# Patient Record
Sex: Female | Born: 1975 | Race: White | Hispanic: No | Marital: Married | Smoking: Former smoker
Health system: Southern US, Community
[De-identification: ages and names within clinical notes are randomized; demographics above are authoritative.]

## PROBLEM LIST (undated history)

## (undated) DIAGNOSIS — D259 Leiomyoma of uterus, unspecified: Secondary | ICD-10-CM

## (undated) DIAGNOSIS — N920 Excessive and frequent menstruation with regular cycle: Secondary | ICD-10-CM

## (undated) DIAGNOSIS — Z8669 Personal history of other diseases of the nervous system and sense organs: Secondary | ICD-10-CM

## (undated) HISTORY — PX: ABLATION: SHX5711

## (undated) HISTORY — PX: NO PAST SURGERIES: SHX2092

## (undated) HISTORY — PX: MOUTH SURGERY: SHX715

---

## 2018-07-19 ENCOUNTER — Other Ambulatory Visit: Payer: Self-pay | Admitting: Obstetrics & Gynecology

## 2018-07-19 DIAGNOSIS — R928 Other abnormal and inconclusive findings on diagnostic imaging of breast: Secondary | ICD-10-CM

## 2018-07-21 ENCOUNTER — Other Ambulatory Visit: Payer: Self-pay | Admitting: Obstetrics & Gynecology

## 2018-07-21 ENCOUNTER — Other Ambulatory Visit: Payer: Self-pay

## 2018-07-21 ENCOUNTER — Ambulatory Visit
Admission: RE | Admit: 2018-07-21 | Discharge: 2018-07-21 | Disposition: A | Discharge status: Home / Self Care | Payer: BC Managed Care – PPO | Source: Ambulatory Visit | Attending: Obstetrics & Gynecology | Admitting: Obstetrics & Gynecology

## 2018-07-21 DIAGNOSIS — N6320 Unspecified lump in the left breast, unspecified quadrant: Secondary | ICD-10-CM

## 2018-07-21 DIAGNOSIS — R928 Other abnormal and inconclusive findings on diagnostic imaging of breast: Secondary | ICD-10-CM

## 2019-01-21 ENCOUNTER — Inpatient Hospital Stay: Admission: RE | Admit: 2019-01-21 | Payer: BC Managed Care – PPO | Source: Ambulatory Visit

## 2019-07-06 ENCOUNTER — Other Ambulatory Visit: Payer: BC Managed Care – PPO

## 2019-10-19 ENCOUNTER — Ambulatory Visit (HOSPITAL_COMMUNITY)
Admission: EM | Admit: 2019-10-19 | Discharge: 2019-10-19 | Disposition: A | Discharge status: Home / Self Care | Payer: BC Managed Care – PPO | Attending: Urgent Care | Admitting: Urgent Care

## 2019-10-19 ENCOUNTER — Other Ambulatory Visit: Payer: Self-pay

## 2019-10-19 ENCOUNTER — Encounter (HOSPITAL_COMMUNITY): Payer: Self-pay | Admitting: Emergency Medicine

## 2019-10-19 DIAGNOSIS — M79672 Pain in left foot: Secondary | ICD-10-CM

## 2019-10-19 DIAGNOSIS — S96912A Strain of unspecified muscle and tendon at ankle and foot level, left foot, initial encounter: Secondary | ICD-10-CM

## 2019-10-19 MED ORDER — NAPROXEN 500 MG PO TABS: 500.0000 mg | ORAL_TABLET | Freq: Two times a day (BID) | ORAL | 0 refills | Status: DC
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
## 2019-12-08 ENCOUNTER — Other Ambulatory Visit: Payer: Self-pay | Admitting: Obstetrics & Gynecology

## 2019-12-08 DIAGNOSIS — N63 Unspecified lump in unspecified breast: Secondary | ICD-10-CM

## 2020-01-04 ENCOUNTER — Ambulatory Visit
Admission: RE | Admit: 2020-01-04 | Discharge: 2020-01-04 | Disposition: A | Discharge status: Home / Self Care | Payer: BC Managed Care – PPO | Source: Ambulatory Visit | Attending: Obstetrics & Gynecology | Admitting: Obstetrics & Gynecology

## 2020-01-04 ENCOUNTER — Other Ambulatory Visit: Payer: Self-pay

## 2020-01-04 DIAGNOSIS — N63 Unspecified lump in unspecified breast: Secondary | ICD-10-CM

## 2020-01-23 ENCOUNTER — Other Ambulatory Visit: Payer: Self-pay | Admitting: Obstetrics & Gynecology

## 2020-01-23 DIAGNOSIS — R928 Other abnormal and inconclusive findings on diagnostic imaging of breast: Secondary | ICD-10-CM

## 2020-05-13 IMAGING — MG DIGITAL DIAGNOSTIC UNILATERAL LEFT MAMMOGRAM WITH TOMO AND CAD
6 series · 6 of 18 positions shown · non-contrast
Comparison: Previous exam(s).

CLINICAL DATA: Possible mass in the posterior aspect of the
upper-outer left breast on a recent screening mammogram.

EXAM:
DIGITAL DIAGNOSTIC LEFT MAMMOGRAM WITH CAD AND TOMO
ULTRASOUND LEFT BREAST

[L CC synth-2D (1 of 2)]
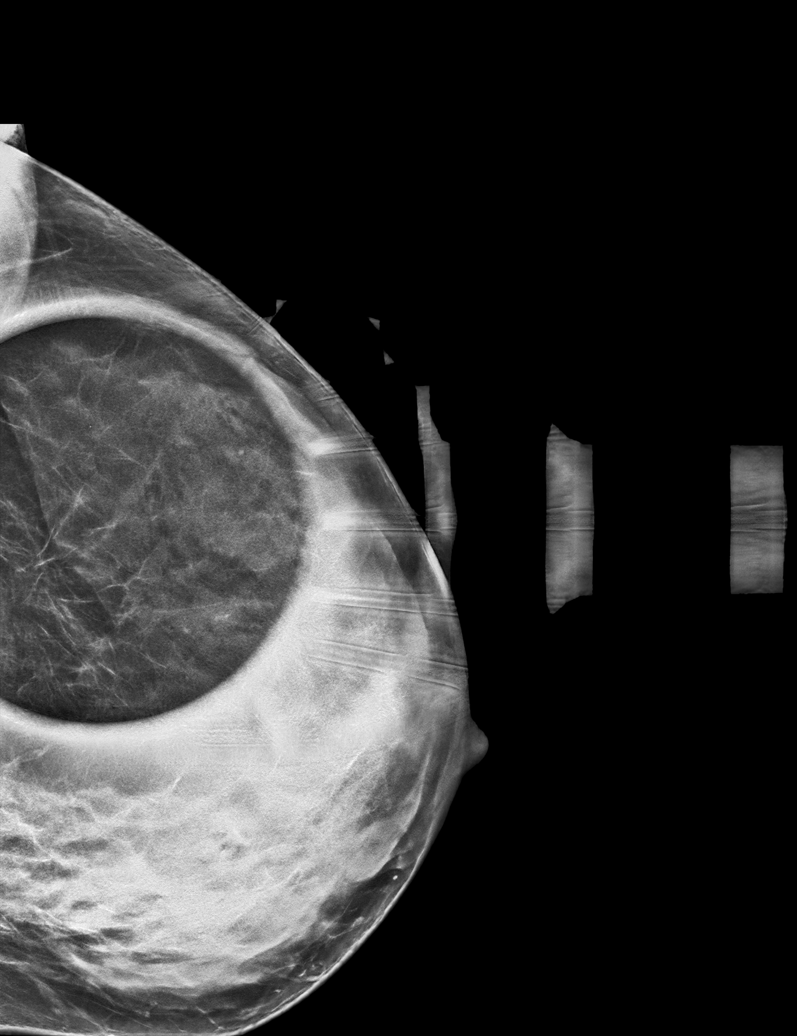

[L CC synth-2D (2 of 2)]
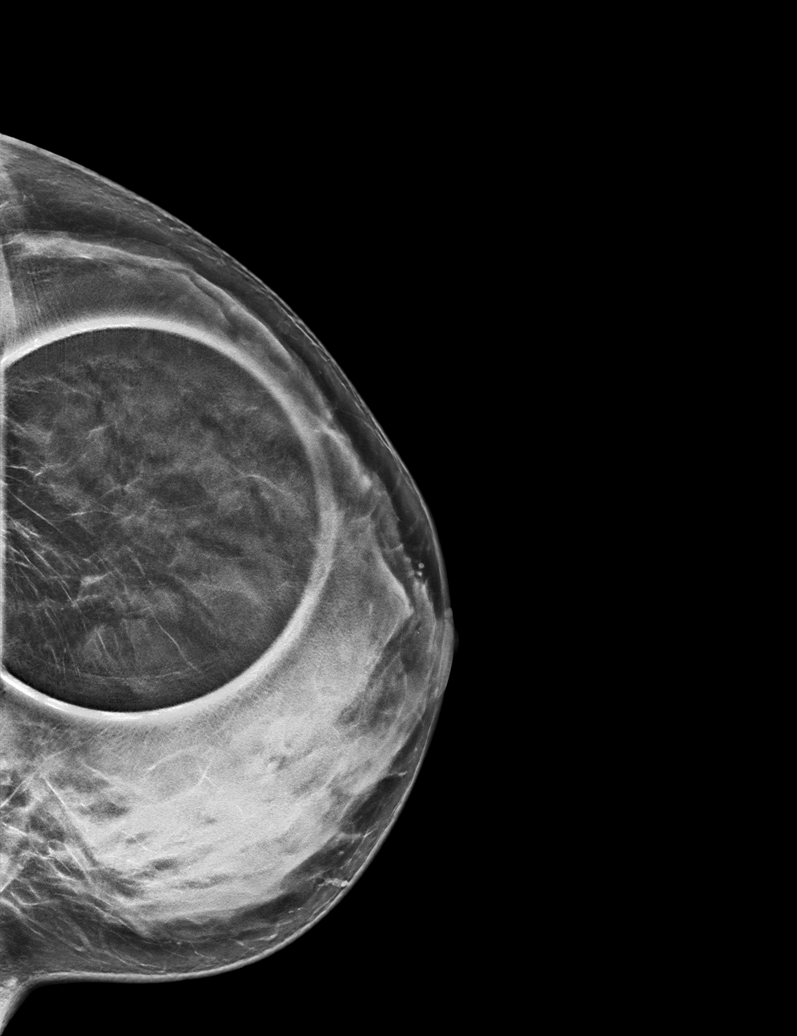

[L MLO synth-2D]
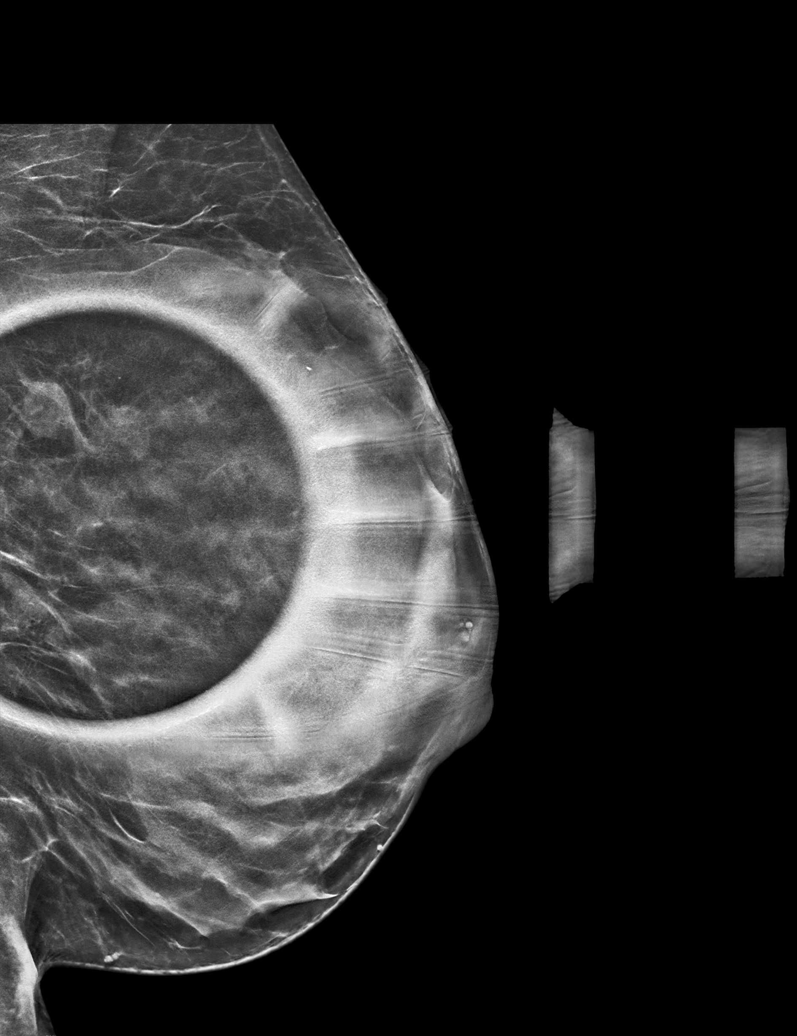

[L CC tomo (1 of 2) · tomo slice 32/63.0]
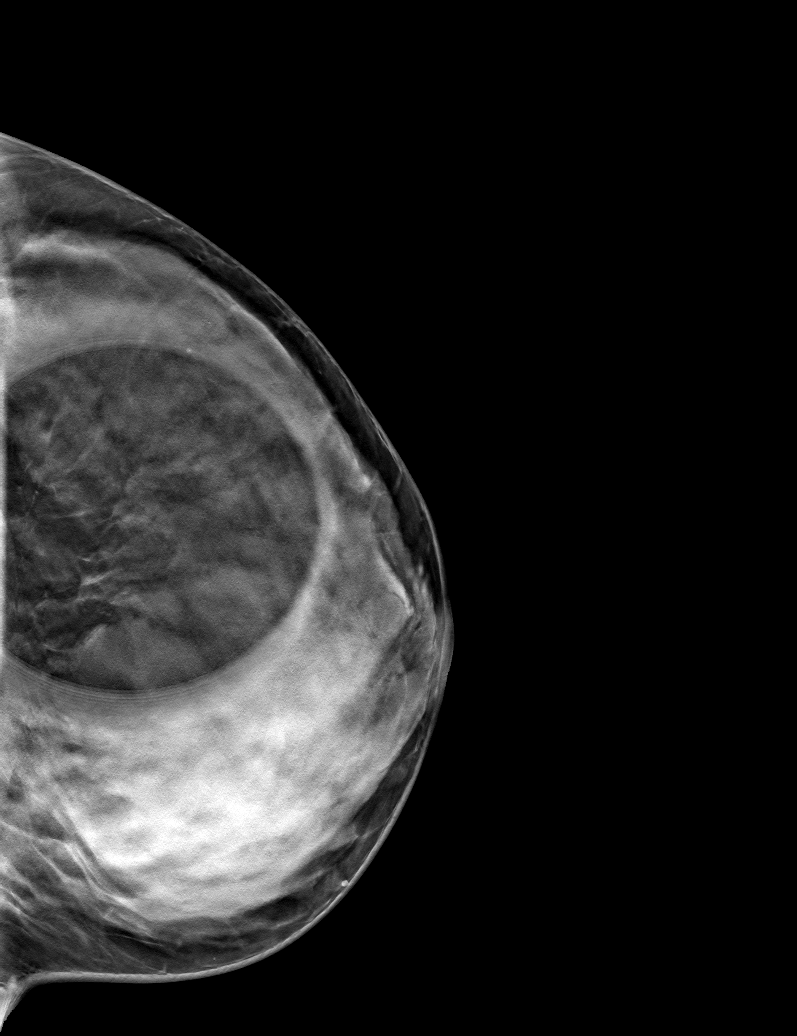

[L MLO tomo · tomo slice 31/60.0]
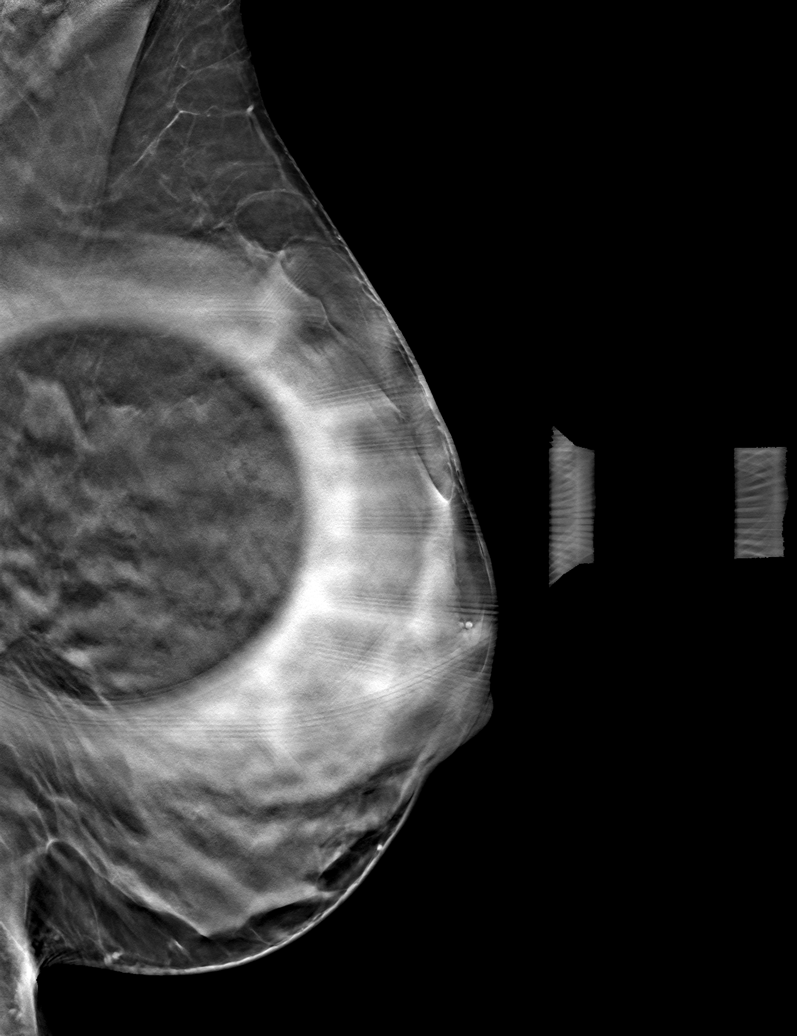

[L CC tomo (2 of 2) · tomo slice 29/57.0]
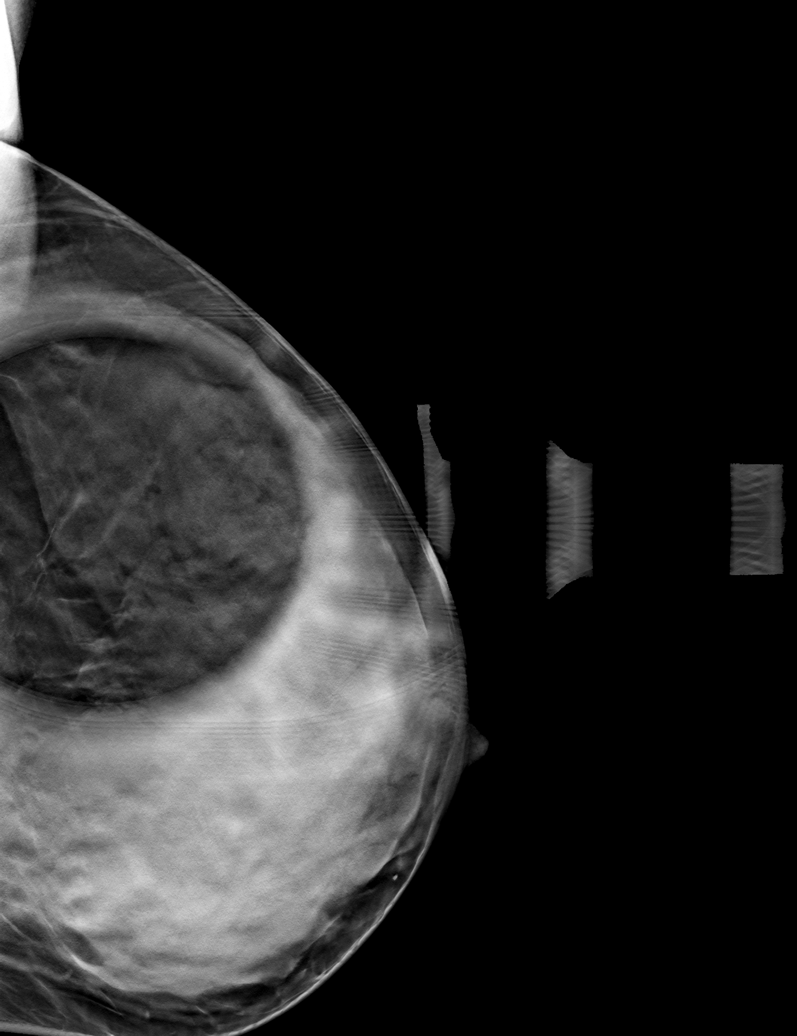

[6 of 18 positions shown; findings below may reference images not displayed]

ACR Breast Density Category d: The breast tissue is extremely dense,
which lowers the sensitivity of mammography.
FINDINGS: 3D tomographic and 2D generated spot compression views the breast
confirm an oval mass with circumscribed and partially obscured
margins in posterior aspect of the upper-outer quadrant of the
breast.

Mammographic images were processed with CAD.

Targeted ultrasound is performed, showing a 1.0 x 1.0 x 0.7 cm oval,
horizontally oriented, hypoechoic mass in the posterior aspect of
the 2:30 o'clock position of the left breast, 6 cm from the nipple.
This corresponds to the mammographic mass.

There is an adjacent 1.9 x 1.9 x 0.6 cm similar-appearing mass more
anteriorly in the 2:30 o'clock position of the left breast, 6 cm
from the nipple.

There is a 3rd adjacent similar-appearing mass in the 2:30 o'clock
position of the left breast, 6 cm from the nipple, in the middle
depth. This measures 1.2 x 1.0 x 0.5 cm.
IMPRESSION: 3 adjacent probable fibroadenomas in the 2:30 o'clock position of
the left breast, as described above.

RECOMMENDATION:
Left breast ultrasound in 6 months. The option of ultrasound-guided
core needle biopsy was also discussed with the patient but not
recommended at this time. She is currently comfortable with the 6
month followup ultrasound.

I have discussed the findings and recommendations with the patient.
Results were also provided in writing at the conclusion of the
visit. If applicable, a reminder letter will be sent to the patient
regarding the next appointment.

BI-RADS CATEGORY  3: Probably benign.

## 2020-07-05 ENCOUNTER — Inpatient Hospital Stay: Admission: RE | Admit: 2020-07-05 | Payer: BC Managed Care – PPO | Source: Ambulatory Visit

## 2021-03-01 ENCOUNTER — Encounter (HOSPITAL_BASED_OUTPATIENT_CLINIC_OR_DEPARTMENT_OTHER): Payer: Self-pay | Admitting: Obstetrics and Gynecology

## 2021-03-01 ENCOUNTER — Other Ambulatory Visit: Payer: Self-pay

## 2021-03-01 NOTE — Progress Notes (Signed)
Spoke w/ via phone for pre-op interview--- pt Lab needs dos----   urine preg (per anes)/  pre-op orders pending            Lab results------ n/a COVID test -----patient states asymptomatic no test needed Arrive at ------- 0530 on 03-07-2021 NPO after MN NO Solid Food.  Clear liquids from MN until--- 0430 Med rec completed Medications to take morning of surgery ----- none Diabetic medication ----- Patient instructed no nail polish to be worn day of surgery Patient instructed to bring photo id and insurance card day of surgery Patient aware to have Driver (ride ) / caregiver  for 24 hours after surgery -- husband, Buyer, retail Patient Special Instructions ----- n/a Pre-Op special Istructions ----- sent inbox message in epic to dr Corinna Capra , requested orders Patient verbalized understanding of instructions that were given at this phone interview. Patient denies shortness of breath, chest pain, fever, cough at this phone interview.

## 2021-03-07 ENCOUNTER — Ambulatory Visit (HOSPITAL_BASED_OUTPATIENT_CLINIC_OR_DEPARTMENT_OTHER): Payer: BC Managed Care – PPO | Admitting: Anesthesiology

## 2021-03-07 ENCOUNTER — Other Ambulatory Visit: Payer: Self-pay

## 2021-03-07 ENCOUNTER — Encounter (HOSPITAL_BASED_OUTPATIENT_CLINIC_OR_DEPARTMENT_OTHER): Payer: Self-pay | Admitting: Obstetrics and Gynecology

## 2021-03-07 ENCOUNTER — Encounter (HOSPITAL_BASED_OUTPATIENT_CLINIC_OR_DEPARTMENT_OTHER)
Admission: RE | Disposition: A | Discharge status: Home / Self Care | Payer: Self-pay | Source: Home / Self Care | Attending: Obstetrics and Gynecology

## 2021-03-07 ENCOUNTER — Ambulatory Visit (HOSPITAL_BASED_OUTPATIENT_CLINIC_OR_DEPARTMENT_OTHER)
Admission: RE | Admit: 2021-03-07 | Discharge: 2021-03-07 | Disposition: A | Discharge status: Home / Self Care | Payer: BC Managed Care – PPO | Attending: Obstetrics and Gynecology | Admitting: Obstetrics and Gynecology

## 2021-03-07 DIAGNOSIS — N920 Excessive and frequent menstruation with regular cycle: Secondary | ICD-10-CM | POA: Diagnosis present

## 2021-03-07 DIAGNOSIS — Z01818 Encounter for other preprocedural examination: Secondary | ICD-10-CM

## 2021-03-07 DIAGNOSIS — N939 Abnormal uterine and vaginal bleeding, unspecified: Secondary | ICD-10-CM | POA: Insufficient documentation

## 2021-03-07 DIAGNOSIS — D250 Submucous leiomyoma of uterus: Secondary | ICD-10-CM | POA: Diagnosis not present

## 2021-03-07 DIAGNOSIS — D251 Intramural leiomyoma of uterus: Secondary | ICD-10-CM | POA: Insufficient documentation

## 2021-03-07 HISTORY — DX: Leiomyoma of uterus, unspecified: D25.9

## 2021-03-07 HISTORY — DX: Personal history of other diseases of the nervous system and sense organs: Z86.69

## 2021-03-07 HISTORY — DX: Excessive and frequent menstruation with regular cycle: N92.0

## 2021-03-07 HISTORY — PX: DILATATION & CURETTAGE/HYSTEROSCOPY WITH MYOSURE: SHX6511

## 2021-03-07 LAB — CBC
HCT: 44.2 % (ref 36.0–46.0)
Hemoglobin: 15.6 g/dL — ABNORMAL HIGH (ref 12.0–15.0)
MCH: 30.8 pg (ref 26.0–34.0)
MCHC: 35.3 g/dL (ref 30.0–36.0)
MCV: 87.2 fL (ref 80.0–100.0)
Platelets: 267 10*3/uL (ref 150–400)
RBC: 5.07 MIL/uL (ref 3.87–5.11)
RDW: 12.9 % (ref 11.5–15.5)
WBC: 6.2 10*3/uL (ref 4.0–10.5)
nRBC: 0 % (ref 0.0–0.2)

## 2021-03-07 LAB — TYPE AND SCREEN
ABO/RH(D): O POS
Antibody Screen: NEGATIVE

## 2021-03-07 LAB — POCT PREGNANCY, URINE: Preg Test, Ur: NEGATIVE

## 2021-03-07 LAB — ABO/RH: ABO/RH(D): O POS

## 2021-03-07 SURGERY — DILATATION & CURETTAGE/HYSTEROSCOPY WITH MYOSURE
Anesthesia: General | Site: Vagina

## 2021-03-07 MED ORDER — HYDROMORPHONE HCL 1 MG/ML IJ SOLN
0.2500 mg | INTRAMUSCULAR | Status: DC | PRN
  Administered 2021-03-07: 0.25 mg via INTRAVENOUS

## 2021-03-07 MED ORDER — POVIDONE-IODINE 10 % EX SWAB: 2.0000 "application " | Freq: Once | CUTANEOUS | Status: DC

## 2021-03-07 MED ORDER — DEXAMETHASONE SODIUM PHOSPHATE 4 MG/ML IJ SOLN
INTRAMUSCULAR | Status: DC | PRN
  Administered 2021-03-07: 8 mg via INTRAVENOUS

## 2021-03-07 MED ORDER — HYDROMORPHONE HCL 1 MG/ML IJ SOLN
INTRAMUSCULAR | Status: AC
  Filled 2021-03-07: qty 1

## 2021-03-07 MED ORDER — FENTANYL CITRATE (PF) 100 MCG/2ML IJ SOLN
INTRAMUSCULAR | Status: DC | PRN
Start: 2021-03-07 — End: 2021-03-07
  Administered 2021-03-07: 50 ug via INTRAVENOUS
  Administered 2021-03-07 (×2): 25 ug via INTRAVENOUS

## 2021-03-07 MED ORDER — PROPOFOL 10 MG/ML IV BOLUS
INTRAVENOUS | Status: DC | PRN
  Administered 2021-03-07: 200 mg via INTRAVENOUS

## 2021-03-07 MED ORDER — FENTANYL CITRATE (PF) 100 MCG/2ML IJ SOLN
INTRAMUSCULAR | Status: AC
  Filled 2021-03-07: qty 2

## 2021-03-07 MED ORDER — MIDAZOLAM HCL 2 MG/2ML IJ SOLN
INTRAMUSCULAR | Status: AC
  Filled 2021-03-07: qty 2

## 2021-03-07 MED ORDER — LIDOCAINE HCL (CARDIAC) PF 100 MG/5ML IV SOSY
PREFILLED_SYRINGE | INTRAVENOUS | Status: DC | PRN
  Administered 2021-03-07: 100 mg via INTRAVENOUS

## 2021-03-07 MED ORDER — KETOROLAC TROMETHAMINE 30 MG/ML IJ SOLN: 30.0000 mg | Freq: Once | INTRAMUSCULAR | Status: DC | PRN

## 2021-03-07 MED ORDER — MEPERIDINE HCL 25 MG/ML IJ SOLN
6.2500 mg | INTRAMUSCULAR | Status: DC | PRN
  Administered 2021-03-07: 6.25 mg via INTRAVENOUS

## 2021-03-07 MED ORDER — ACETAMINOPHEN 500 MG PO TABS: 1000.0000 mg | ORAL_TABLET | Freq: Once | ORAL | Status: DC

## 2021-03-07 MED ORDER — MEPERIDINE HCL 25 MG/ML IJ SOLN
INTRAMUSCULAR | Status: AC
  Filled 2021-03-07: qty 1

## 2021-03-07 MED ORDER — ACETAMINOPHEN 10 MG/ML IV SOLN
INTRAVENOUS | Status: AC
  Filled 2021-03-07: qty 100

## 2021-03-07 MED ORDER — PROMETHAZINE HCL 25 MG/ML IJ SOLN: 6.2500 mg | INTRAMUSCULAR | Status: DC | PRN

## 2021-03-07 MED ORDER — SILVER NITRATE-POT NITRATE 75-25 % EX MISC
CUTANEOUS | Status: DC | PRN
  Administered 2021-03-07: 1

## 2021-03-07 MED ORDER — OXYCODONE HCL 5 MG PO TABS: 5.0000 mg | ORAL_TABLET | Freq: Once | ORAL | Status: DC | PRN

## 2021-03-07 MED ORDER — OXYCODONE HCL 5 MG/5ML PO SOLN
5.0000 mg | Freq: Once | ORAL | Status: DC | PRN
Start: 2021-03-07 — End: 2021-03-07

## 2021-03-07 MED ORDER — KETOROLAC TROMETHAMINE 30 MG/ML IJ SOLN
INTRAMUSCULAR | Status: DC | PRN
  Administered 2021-03-07: 30 mg via INTRAVENOUS

## 2021-03-07 MED ORDER — STERILE WATER FOR IRRIGATION IR SOLN
Status: DC | PRN
  Administered 2021-03-07: 500 mL

## 2021-03-07 MED ORDER — ONDANSETRON HCL 4 MG/2ML IJ SOLN
INTRAMUSCULAR | Status: DC | PRN
  Administered 2021-03-07: 4 mg via INTRAVENOUS

## 2021-03-07 MED ORDER — SODIUM CHLORIDE 0.9 % IR SOLN
Status: DC | PRN
  Administered 2021-03-07 (×3): 3000 mL

## 2021-03-07 MED ORDER — PROPOFOL 500 MG/50ML IV EMUL
INTRAVENOUS | Status: AC
  Filled 2021-03-07: qty 50

## 2021-03-07 MED ORDER — ACETAMINOPHEN 10 MG/ML IV SOLN
INTRAVENOUS | Status: DC | PRN
  Administered 2021-03-07: 1000 mg via INTRAVENOUS

## 2021-03-07 MED ORDER — SODIUM CHLORIDE 0.9 % IV SOLN
INTRAVENOUS | Status: AC
  Filled 2021-03-07: qty 2

## 2021-03-07 MED ORDER — MIDAZOLAM HCL 2 MG/2ML IJ SOLN
INTRAMUSCULAR | Status: DC | PRN
  Administered 2021-03-07: 2 mg via INTRAVENOUS

## 2021-03-07 MED ORDER — SODIUM CHLORIDE 0.9 % IV SOLN
2.0000 g | INTRAVENOUS | Status: AC
  Administered 2021-03-07: 2 g via INTRAVENOUS

## 2021-03-07 MED ORDER — LACTATED RINGERS IV SOLN
INTRAVENOUS | Status: DC
  Administered 2021-03-07: 1000 mL via INTRAVENOUS

## 2021-03-07 SURGICAL SUPPLY — 23 items
CATH ROBINSON RED A/P 16FR (CATHETERS) ×2 IMPLANT
ELECT DISPERSIVE SONATA (ELECTRODE) ×4 IMPLANT
GAUZE 4X4 16PLY ~~LOC~~+RFID DBL (SPONGE) ×3 IMPLANT
GLOVE SURG ENC MOIS LTX SZ8 (GLOVE) ×2 IMPLANT
GLOVE SURG NEOPR MICRO LF SZ6 (GLOVE) ×1 IMPLANT
GLOVE SURG ORTHO LTX SZ8 (GLOVE) ×4 IMPLANT
GLOVE SURG UNDER POLY LF SZ6 (GLOVE) ×2 IMPLANT
GLOVE SURG UNDER POLY LF SZ7 (GLOVE) ×1 IMPLANT
GOWN STRL REUS W/TWL LRG LVL3 (GOWN DISPOSABLE) ×3 IMPLANT
GOWN STRL REUS W/TWL XL LVL3 (GOWN DISPOSABLE) ×2 IMPLANT
HANDPIECE RFA SONATA (MISCELLANEOUS) ×2 IMPLANT
IV NS IRRIG 3000ML ARTHROMATIC (IV SOLUTION) ×4 IMPLANT
KIT PROCEDURE FLUENT (KITS) ×2 IMPLANT
KIT TURNOVER CYSTO (KITS) ×2 IMPLANT
MYOSURE XL FIBROID (MISCELLANEOUS) ×2
PACK VAGINAL MINOR WOMEN LF (CUSTOM PROCEDURE TRAY) ×2 IMPLANT
PAD OB MATERNITY 4.3X12.25 (PERSONAL CARE ITEMS) ×2 IMPLANT
PAD PREP 24X48 CUFFED NSTRL (MISCELLANEOUS) ×2 IMPLANT
SEAL ROD LENS SCOPE MYOSURE (ABLATOR) ×2 IMPLANT
SYR 50ML LL SCALE MARK (SYRINGE) ×2 IMPLANT
SYSTEM TISS REMOVAL MYOSURE XL (MISCELLANEOUS) IMPLANT
TOWEL OR 17X26 10 PK STRL BLUE (TOWEL DISPOSABLE) ×2 IMPLANT
WATER STERILE IRR 500ML POUR (IV SOLUTION) ×2 IMPLANT

## 2021-03-07 NOTE — Anesthesia Procedure Notes (Signed)
Procedure Name: LMA Insertion Date/Time: 03/07/2021 7:54 AM Performed by: Georgeanne Nim, CRNA Pre-anesthesia Checklist: Patient identified, Emergency Drugs available, Suction available, Patient being monitored and Timeout performed Patient Re-evaluated:Patient Re-evaluated prior to induction Oxygen Delivery Method: Circle system utilized Preoxygenation: Pre-oxygenation with 100% oxygen Induction Type: IV induction Ventilation: Mask ventilation without difficulty LMA Size: 4.0 Number of attempts: 1 Placement Confirmation: positive ETCO2, CO2 detector and breath sounds checked- equal and bilateral Tube secured with: Tape Dental Injury: Teeth and Oropharynx as per pre-operative assessment

## 2021-03-07 NOTE — H&P (Signed)
Donna Maxwell is an 46 y.o. female presents for surgical evaluation and treatment of AUB, Menorrhagia and fibroids.  Hx of menorrhagia expelling IUD.  US shows mostly intracavitary fibroid 2.5cm in size and 1.5cm intramural fibroid.  The bleeding has not responded to conservative management including IUD, ocas, and progesterone.  Pertinent Gynecological History: Menstrual History: AUB and menorrhagia and fibroids as above Patient's last menstrual period was 02/18/2021 (exact date).    Past Medical History:  Diagnosis Date   History of seizures as a child    per pt as child febrile seizures until age 89, none since   Menorrhagia    Uterine leiomyoma     Past Surgical History:  Procedure Laterality Date   NO PAST SURGERIES      Family History  Problem Relation Age of Onset   Breast cancer Sister 30   Hypertension Mother    Hyperlipidemia Mother    Heart failure Mother    Cancer Father     Social History:  reports that she quit smoking about 5 years ago. Her smoking use included cigarettes. She has never used smokeless tobacco. She reports that she does not currently use alcohol. She reports that she does not use drugs.  Allergies:  Allergies  Allergen Reactions   Other Hives and Itching    ALL NUT TYPES---- per pt cause hives and itching of tongue    Medications Prior to Admission  Medication Sig Dispense Refill Last Dose   acetaminophen (TYLENOL) 500 MG tablet Take 500 mg by mouth every 6 (six) hours as needed.   Past Week    Review of Systems  Blood pressure 123/72, pulse 78, temperature 98.1 F (36.7 C), temperature source Oral, resp. rate 15, height 5' 2.5" (1.588 m), weight 71.3 kg, last menstrual period 02/18/2021, SpO2 96 %. Physical ExamVitals and nursing note reviewed. Exam conducted with a chaperone present.  Constitutional:      Appearance: Normal appearance.  HENT:     Head: Normocephalic.  Eyes:     Pupils: Pupils are equal, round, and reactive to light.   Cardiovascular:     Rate and Rhythm: Normal rate and regular rhythm.     Pulses: Normal pulses.  Abdominal:     General: Abdomen is Gravid, nontender Neurological:     Mental Status: She is alert.  Uterus AV mobile 8 weeks size US findings as above in HPI  Results for orders placed or performed during the hospital encounter of 03/07/21 (from the past 24 hour(s))  Pregnancy, urine POC     Status: None   Collection Time: 03/07/21  5:48 AM  Result Value Ref Range   Preg Test, Ur NEGATIVE NEGATIVE  CBC     Status: Abnormal   Collection Time: 03/07/21  6:21 AM  Result Value Ref Range   WBC 6.2 4.0 - 10.5 K/uL   RBC 5.07 3.87 - 5.11 MIL/uL   Hemoglobin 15.6 (H) 12.0 - 15.0 g/dL   HCT 44.2 36.0 - 46.0 %   MCV 87.2 80.0 - 100.0 fL   MCH 30.8 26.0 - 34.0 pg   MCHC 35.3 30.0 - 36.0 g/dL   RDW 12.9 11.5 - 15.5 %   Platelets 267 150 - 400 K/uL   nRBC 0.0 0.0 - 0.2 %    No results found.  Assessment/Plan: AUB, Menorrhagia, fibroids Plan hysteroscopy, myosure resection of intracavitary/submucousal fibroid and sonata fibroid ablation of fibroids Risks and benefits of the procedure were discussed at length which include but not  limited to risks of infection, injury to bowel, bladder, uterus, ovaries, risk of blood loss, blood transfusion, possible laparotomy, and risks associated with anesthesia.  She gives her informed consent and wishes to proceed.   Luz Lex 03/07/2021, 7:39 AM

## 2021-03-07 NOTE — Transfer of Care (Signed)
Immediate Anesthesia Transfer of Care Note  Patient: Donna Maxwell  Procedure(s) Performed: HYSTEROSCOPY WITH MYOSURE RESECTION OF FIBROID (Vagina ) Radio Frequency Ablation with Sonata (Vagina )  Patient Location: PACU  Anesthesia Type:General  Level of Consciousness: awake and patient cooperative  Airway & Oxygen Therapy: Patient Spontanous Breathing and Patient connected to nasal cannula oxygen  Post-op Assessment: Report given to RN and Post -op Vital signs reviewed and stable  Post vital signs: Reviewed and stable  Last Vitals:  Vitals Value Taken Time  BP 128/46 03/07/21 0933  Temp    Pulse 99 03/07/21 0936  Resp 16 03/07/21 0936  SpO2 96 % 03/07/21 0936  Vitals shown include unvalidated device data.  Last Pain:  Vitals:   03/07/21 0605  TempSrc: Oral  PainSc: 0-No pain      Patients Stated Pain Goal: 7 (41/99/14 [id number])  Complications: No notable events documented.

## 2021-03-07 NOTE — Op Note (Signed)
Donna Maxwell, TUFF MEDICAL RECORD NO: [id number] ACCOUNT NO: [id number] DATE OF BIRTH: Oct 27, 1975 FACILITY: [facility] LOCATION: [facility] PHYSICIAN: Monia Sabal. Corinna Capra, MD  Operative Report   DATE OF PROCEDURE: 03/07/2021  PREOPERATIVE DIAGNOSES:  Abnormal uterine bleeding, menorrhagia, uterine leiomyoma.  POSTOPERATIVE DIAGNOSES:  Abnormal uterine bleeding, menorrhagia, uterine leiomyoma.  PROCEDURE:  Hysteroscopy with MyoSure resection of intracavitary fibroid and Sonata fibroid and ablation of uterine fibroids.  ANESTHESIA:  General endotracheal.  SURGEON:  Louretta Shorten, MD  INDICATIONS:  The patient is a 46 year old who presents for surgical evaluation and treatment of abnormal uterine bleeding, menorrhagia and fibroids.  She has a history of menorrhagia expelling on previous IUD, ultrasound including saline infusion  ultrasound shows a 2.5 cm intracavitary fibroid and a 1.5 cm intramural fibroid.  The bleeding does not respond to conservative medical management including IUD, oral contraceptive agents and progesterone therapy.  She presents for definitive surgical  evaluation and treatment including fibroid ablation as well as resection of the submucosal and intracavitary fibroid.  We discussed the risks and benefits of the procedure at length, which include, but not limited to risk of infection, bleeding, damage  to the uterus, tubes, ovaries, bowel, bladder, possibly this may not alleviate the bleeding could recur or worsen.  She does give her informed consent and wished to proceed.  FINDINGS AT TIME OF SURGERY:  2.5 cm intracavitary mass, which was 35% intracavitary.  Also, on ultrasound, a 2 cm intramural fibroid in addition to the 2.5 cm submucosal intracavitary fibroid.  DESCRIPTION OF PROCEDURE:  After adequate analgesia, the patient was placed in the dorsal lithotomy position.  She was sterilely prepped and draped.  Bladder was sterilely drained.  A weighted speculum was placed.  Tenaculum  placed on the anterior lip of  the cervix.  Uterus was sounded to 9 cm in a retroverted position.  She was easily dilated to a #25 Pratt dilator and hysteroscope was inserted.  The above findings were noted.  The Sonata vibration device was inserted and after careful mapping the  first fibroid was 1.5 x 2 cm in size, located at the 1 o'clock position.  Two fibroid ablations first one was 1 minute, the second was 1 minute and 54 seconds of this left lateral fundal fibroid with good thermal cautery effect noted and care taken to  avoid injury to adjacent structures.  We were able to map the second fibroid, which was the one which was noted to be intracavitary and it was 2.3 x 1.7 cm in size.  It was ablated twice, the first at 1 minute 30 seconds, the second being 1 minute 42  seconds.  It was located in the right mid lateral position with good thermal cautery effect noted and also well within the safety zone. Care taken to avoid the adjacent bowel and bladder. The Sonata device was then removed.  The hysteroscope was then  reinserted.  MyoSure instrument was used to resect the majority of the intracavitary component of the fibroid on the right lateral wall with no obvious injury to bowel, bladder or uterus.  No residual fibroid was noted after the procedure.  The  hysteroscope was then removed.  Tenaculum removed from the cervix.  Hemostasis was achieved with silver nitrate and direct pressure.  The patient was stable and transferred to recovery room.  Sponge, needle and instrument count was normal x3.  Estimated  blood loss was minimal.  We did achieve 2500 mL saline deficit on hysteroscope.  The patient received 2  grams of cefotetan preoperatively.  DISPOSITION:  The patient will be discharged to home with followup in the office in 2-3 weeks.  I told to return for increased pain, fever or bleeding.   PUS D: 03/07/2021 9:26:17 am T: 03/07/2021 12:52:00 pm  JOB: [id number]/ [id number]

## 2021-03-07 NOTE — Discharge Instructions (Signed)
NO IBUPROFEN CONTAINING PRODUCTS UNTIL AFTER 2:45 PM TODAY. (IBUPROFEN, ADVIL, MOTRIN, ALEVE, NAPROXEN).    DISCHARGE INSTRUCTIONS: HYSTEROSCOPY / ENDOMETRIAL ABLATION The following instructions have been prepared to help you care for yourself upon your return home.  Personal hygiene:  Use sanitary pads for vaginal drainage, not tampons.  Shower the day after your procedure.  NO tub baths, pools or Jacuzzis for 2-3 weeks.  Wipe front to back after using the bathroom.  Activity and limitations:  Do NOT drive or operate any equipment for 24 hours. The effects of anesthesia are still present and drowsiness may result.  Do NOT rest in bed all day.  Walking is encouraged.  Walk up and down stairs slowly.  You may resume your normal activity in one to two days or as indicated by your physician. Sexual activity: NO intercourse for at least 2 weeks after the procedure, or as indicated by your Doctor.  Diet: Eat a light meal as desired this evening. You may resume your usual diet tomorrow.  Return to Work: You may resume your work activities in one to two days or as indicated by Marine scientist.  What to expect after your surgery: Expect to have vaginal bleeding/discharge for 2-3 days and spotting for up to 10 days. It is not unusual to have soreness for up to 1-2 weeks. You may have a slight burning sensation when you urinate for the first day. Mild cramps may continue for a couple of days. You may have a regular period in 2-6 weeks.  Call your doctor for any of the following:  Excessive vaginal bleeding or clotting, saturating and changing one pad every hour.  Inability to urinate 6 hours after discharge from hospital.  Pain not relieved by pain medication.  Fever of 100.4 F or greater.  Unusual vaginal discharge or odor.    Post Anesthesia Home Care Instructions  Activity: Get plenty of rest for the remainder of the day. A responsible individual must stay with you for 24 hours  following the procedure.  For the next 24 hours, DO NOT: -Drive a car -Paediatric nurse -Drink alcoholic beverages -Take any medication unless instructed by your physician -Make any legal decisions or sign important papers.  Meals: Start with liquid foods such as gelatin or soup. Progress to regular foods as tolerated. Avoid greasy, spicy, heavy foods. If nausea and/or vomiting occur, drink only clear liquids until the nausea and/or vomiting subsides. Call your physician if vomiting continues.  Special Instructions/Symptoms: Your throat may feel dry or sore from the anesthesia or the breathing tube placed in your throat during surgery. If this causes discomfort, gargle with warm salt water. The discomfort should disappear within 24 hours.

## 2021-03-07 NOTE — Anesthesia Postprocedure Evaluation (Signed)
Anesthesia Post Note  Patient: Maygan Koeller  Procedure(s) Performed: HYSTEROSCOPY WITH MYOSURE RESECTION OF FIBROID (Vagina ) Radio Frequency Ablation with Sonata (Vagina )     Patient location during evaluation: PACU Anesthesia Type: General Level of consciousness: awake and alert, oriented and patient cooperative Pain management: pain level controlled Vital Signs Assessment: post-procedure vital signs reviewed and stable Respiratory status: spontaneous breathing, nonlabored ventilation and respiratory function stable Cardiovascular status: blood pressure returned to baseline and stable Postop Assessment: no apparent nausea or vomiting Anesthetic complications: no   No notable events documented.  Last Vitals:  Vitals:   03/07/21 0945 03/07/21 1000  BP: 107/61 123/86  Pulse: 95 95  Resp: 20 (!) 22  Temp: 36.6 C   SpO2: 95% 95%    Last Pain:  Vitals:   03/07/21 0605  TempSrc: Oral  PainSc: 0-No pain                 Pervis Hocking

## 2021-03-07 NOTE — Anesthesia Preprocedure Evaluation (Addendum)
Anesthesia Evaluation  Patient identified by MRN, date of birth, ID band Patient awake    Reviewed: Allergy & Precautions, NPO status , Patient's Chart, lab work & pertinent test results  Airway Mallampati: I  TM Distance: >3 FB Neck ROM: Full    Dental no notable dental hx. (+) Teeth Intact, Dental Advisory Given   Pulmonary neg pulmonary ROS, former smoker,    Pulmonary exam normal breath sounds clear to auscultation       Cardiovascular negative cardio ROS Normal cardiovascular exam Rhythm:Regular Rate:Normal     Neuro/Psych Seizures - (childhood), Well Controlled,  negative psych ROS   GI/Hepatic negative GI ROS, Neg liver ROS,   Endo/Other  negative endocrine ROS  Renal/GU negative Renal ROS  Female GU complaint     Musculoskeletal negative musculoskeletal ROS (+)   Abdominal   Peds negative pediatric ROS (+)  Hematology negative hematology ROS (+)   Anesthesia Other Findings   Reproductive/Obstetrics negative OB ROS                            Anesthesia Physical Anesthesia Plan  ASA: 1  Anesthesia Plan: General   Post-op Pain Management: Tylenol PO (pre-op) and Toradol IV (intra-op)   Induction: Intravenous  PONV Risk Score and Plan:   Airway Management Planned: LMA  Additional Equipment: None  Intra-op Plan:   Post-operative Plan: Extubation in OR  Informed Consent: I have reviewed the patients History and Physical, chart, labs and discussed the procedure including the risks, benefits and alternatives for the proposed anesthesia with the patient or authorized representative who has indicated his/her understanding and acceptance.     Dental advisory given  Plan Discussed with: CRNA  Anesthesia Plan Comments:         Anesthesia Quick Evaluation

## 2021-03-08 LAB — SURGICAL PATHOLOGY

## 2021-03-11 ENCOUNTER — Encounter (HOSPITAL_BASED_OUTPATIENT_CLINIC_OR_DEPARTMENT_OTHER): Payer: Self-pay | Admitting: Obstetrics and Gynecology

## 2022-05-01 ENCOUNTER — Encounter: Payer: Self-pay | Admitting: Gastroenterology

## 2022-05-08 ENCOUNTER — Ambulatory Visit (AMBULATORY_SURGERY_CENTER): Payer: BC Managed Care – PPO | Admitting: *Deleted

## 2022-05-08 VITALS — Ht 62.0 in | Wt 165.0 lb

## 2022-05-08 DIAGNOSIS — Z1211 Encounter for screening for malignant neoplasm of colon: Secondary | ICD-10-CM

## 2022-05-08 MED ORDER — NA SULFATE-K SULFATE-MG SULF 17.5-3.13-1.6 GM/177ML PO SOLN: 1.0000 | Freq: Once | ORAL | 0 refills | Status: AC

## 2022-05-08 NOTE — Progress Notes (Signed)

## 2022-05-14 ENCOUNTER — Encounter: Payer: Self-pay | Admitting: Gastroenterology

## 2022-05-30 ENCOUNTER — Encounter: Payer: Self-pay | Admitting: Gastroenterology

## 2022-05-30 ENCOUNTER — Ambulatory Visit (AMBULATORY_SURGERY_CENTER): Payer: BC Managed Care – PPO | Admitting: Gastroenterology

## 2022-05-30 VITALS — BP 110/63 | HR 79 | Temp 98.4°F | Resp 14 | Ht 62.5 in | Wt 165.0 lb

## 2022-05-30 DIAGNOSIS — Z1211 Encounter for screening for malignant neoplasm of colon: Secondary | ICD-10-CM | POA: Diagnosis present

## 2022-05-30 DIAGNOSIS — D122 Benign neoplasm of ascending colon: Secondary | ICD-10-CM | POA: Diagnosis not present

## 2022-05-30 MED ORDER — SODIUM CHLORIDE 0.9 % IV SOLN: 500.0000 mL | INTRAVENOUS | Status: DC

## 2022-05-30 NOTE — Progress Notes (Signed)
[facility] History and Physical   Primary Care Physician:  Candice Camp, MD   Reason for Procedure:  Colorectal cancer screening  Plan:    Screening colonoscopy with possible interventions as needed     HPI: Donna Maxwell is a very pleasant 47 y.o. female here for screening colonoscopy. Denies any nausea, vomiting, abdominal pain, melena or bright red blood per rectum  The risks and benefits as well as alternatives of endoscopic procedure(s) have been discussed and reviewed. All questions answered. The patient agrees to proceed.    Past Medical History:  Diagnosis Date   History of seizures as a child    per pt as child febrile seizures until age 68, none since   Menorrhagia    Uterine leiomyoma     Past Surgical History:  Procedure Laterality Date   ABLATION     uterine wall scraped jan 16,1096   DILATATION & CURETTAGE/HYSTEROSCOPY WITH MYOSURE N/A 03/07/2021   Procedure: HYSTEROSCOPY WITH MYOSURE RESECTION OF FIBROID;  Surgeon: Candice Camp, MD;  Location: [facility];  Service: Gynecology;  Laterality: N/A;   MOUTH SURGERY     2020    Prior to Admission medications   Medication Sig Start Date End Date Taking? Authorizing Provider  acetaminophen (TYLENOL) 500 MG tablet Take 500 mg by mouth every 6 (six) hours as needed.   Yes [provider]    Current Outpatient Medications  Medication Sig Dispense Refill   acetaminophen (TYLENOL) 500 MG tablet Take 500 mg by mouth every 6 (six) hours as needed.     Current Facility-Administered Medications  Medication Dose Route Frequency Provider Last Rate Last Admin   0.9 %  sodium chloride infusion  500 mL Intravenous Continuous Bibi Economos, Eleonore Chiquito, MD        Allergies as of 05/30/2022 - Review Complete 05/30/2022  Allergen Reaction Noted   Other Hives and Itching 03/01/2021    Family History  Problem Relation Age of Onset   Hypertension Mother    Hyperlipidemia Mother    Heart failure  Mother    Cancer Father    Breast cancer Sister 105   Diverticulitis Sister    Lung cancer Sister    Colon cancer Neg Hx    Colon polyps Neg Hx    Crohn's disease Neg Hx    Esophageal cancer Neg Hx    Rectal cancer Neg Hx    Stomach cancer Neg Hx    Ulcerative colitis Neg Hx     Social History   Socioeconomic History   Marital status: Married    Spouse name: Not on file   Number of children: Not on file   Years of education: Not on file   Highest education level: Not on file  Occupational History   Not on file  Tobacco Use   Smoking status: Former    Types: Cigarettes    Quit date: 2018    Years since quitting: 6.2   Smokeless tobacco: Never  Vaping Use   Vaping Use: Never used  Substance and Sexual Activity   Alcohol use: Not Currently   Drug use: Never   Sexual activity: Not on file  Other Topics Concern   Not on file  Social History Narrative   Not on file   Social Determinants of Health   Financial Resource Strain: Not on file  Food Insecurity: Not on file  Transportation Needs: Not on file  Physical Activity: Not on file  Stress: Not on file  Social  Connections: Not on file  Intimate Partner Violence: Not on file    Review of Systems:  All other review of systems negative except as mentioned in the HPI.  Physical Exam: Vital signs in last 24 hours: Blood Pressure 120/81   Pulse 85   Temperature 98.4 F (36.9 C)   Height 5' 2.5" (1.588 m)   Weight 165 lb (74.8 kg)   Oxygen Saturation 97%   Body Mass Index 29.70 kg/m  General:   Alert, NAD Lungs:  Clear .   Heart:  Regular rate and rhythm Abdomen:  Soft, nontender and nondistended. Neuro/Psych:  Alert and cooperative. Normal mood and affect. A and O x 3  Reviewed labs, radiology imaging, old records and pertinent past GI work up  Patient is appropriate for planned procedure(s) and anesthesia in an ambulatory setting   K. Scherry Ran , MD [phone]

## 2022-05-30 NOTE — Progress Notes (Signed)
Report to PACU, RN, vss, BBS= Clear.  

## 2022-05-30 NOTE — Patient Instructions (Signed)
Handouts Provided:  Polyps  YOU HAD AN ENDOSCOPIC PROCEDURE TODAY AT THE [facility]:   Refer to the procedure report that was given to you for any specific questions about what was found during the examination.  If the procedure report does not answer your questions, please call your gastroenterologist to clarify.  If you requested that your care partner not be given the details of your procedure findings, then the procedure report has been included in a sealed envelope for you to review at your convenience later.  YOU SHOULD EXPECT: Some feelings of bloating in the abdomen. Passage of more gas than usual.  Walking can help get rid of the air that was put into your GI tract during the procedure and reduce the bloating. If you had a lower endoscopy (such as a colonoscopy or flexible sigmoidoscopy) you may notice spotting of blood in your stool or on the toilet paper. If you underwent a bowel prep for your procedure, you may not have a normal bowel movement for a few days.  Please Note:  You might notice some irritation and congestion in your nose or some drainage.  This is from the oxygen used during your procedure.  There is no need for concern and it should clear up in a day or so.  SYMPTOMS TO REPORT IMMEDIATELY:  Following lower endoscopy (colonoscopy or flexible sigmoidoscopy):  Excessive amounts of blood in the stool  Significant tenderness or worsening of abdominal pains  Swelling of the abdomen that is new, acute  Fever of 100F or higher  For urgent or emergent issues, a gastroenterologist can be reached at any hour by calling [phone]. Do not use MyChart messaging for urgent concerns.    DIET:  We do recommend a small meal at first, but then you may proceed to your regular diet.  Drink plenty of fluids but you should avoid alcoholic beverages for 24 hours.  ACTIVITY:  You should plan to take it easy for the rest of today and you should NOT DRIVE or use heavy  machinery until tomorrow (because of the sedation medicines used during the test).    FOLLOW UP: Our staff will call the number listed on your records the next business day following your procedure.  We will call around 7:15- 8:00 am to check on you and address any questions or concerns that you may have regarding the information given to you following your procedure. If we do not reach you, we will leave a message.     If any biopsies were taken you will be contacted by phone or by letter within the next 1-3 weeks.  Please call us at [phone] if you have not heard about the biopsies in 3 weeks.    SIGNATURES/CONFIDENTIALITY: You and/or your care partner have signed paperwork which will be entered into your electronic medical record.  These signatures attest to the fact that that the information above on your After Visit Summary has been reviewed and is understood.  Full responsibility of the confidentiality of this discharge information lies with you and/or your care-partner.  

## 2022-05-30 NOTE — Progress Notes (Signed)
Pt's states no medical or surgical changes since previsit or office visit. 

## 2022-05-30 NOTE — Op Note (Signed)
[facility] Patient Name: Donna Maxwell Procedure Date: 05/30/2022 12:35 PM MRN: [id number] Endoscopist: Napoleon Form , MD, [id number] Age: 47 Referring MD:  Date of Birth: 16-Nov-1975 Gender: Female Account #: [id number] Procedure:                Colonoscopy Indications:              Screening for colorectal malignant neoplasm Medicines:                Monitored Anesthesia Care Procedure:                Pre-Anesthesia Assessment:                           - Prior to the procedure, a History and Physical                            was performed, and patient medications and                            allergies were reviewed. The patient's tolerance of                            previous anesthesia was also reviewed. The risks                            and benefits of the procedure and the sedation                            options and risks were discussed with the patient.                            All questions were answered, and informed consent                            was obtained. Prior Anticoagulants: The patient has                            taken no anticoagulant or antiplatelet agents. ASA                            Grade Assessment: II - A patient with mild systemic                            disease. After reviewing the risks and benefits,                            the patient was deemed in satisfactory condition to                            undergo the procedure.                           After obtaining informed consent, the colonoscope  was passed under direct vision. Throughout the                            procedure, the patient's blood pressure, pulse, and                            oxygen saturations were monitored continuously. The                            Olympus PCF-H190DL [id number]) Colonoscope was                            introduced through the anus and advanced to the the                            cecum,  identified by appendiceal orifice and                            ileocecal valve. The colonoscopy was performed                            without difficulty. The patient tolerated the                            procedure well. The quality of the bowel                            preparation was good. The ileocecal valve,                            appendiceal orifice, and rectum were photographed. Scope In: 1:32:02 PM Scope Out: 1:44:58 PM Scope Withdrawal Time: 0 hours 8 minutes 48 seconds  Total Procedure Duration: 0 hours 12 minutes 56 seconds  Findings:                 The perianal and digital rectal examinations were                            normal.                           A 4 mm polyp was found in the ascending colon. The                            polyp was sessile. The polyp was removed with a                            cold snare. Resection and retrieval were complete.                           Non-bleeding external and internal hemorrhoids were                            found during retroflexion. The hemorrhoids were  small. Complications:            No immediate complications. Estimated Blood Loss:     Estimated blood loss was minimal. Impression:               - One 4 mm polyp in the ascending colon, removed                            with a cold snare. Resected and retrieved.                           - Non-bleeding external and internal hemorrhoids.                           - The GI Genius (intelligent endoscopy module),                            computer-aided polyp detection system powered by AI                            was utilized to detect colorectal polyps through                            enhanced visualization during colonoscopy. Recommendation:           - Patient has a contact number available for                            emergencies. The signs and symptoms of potential                            delayed complications were  discussed with the                            patient. Return to normal activities tomorrow.                            Written discharge instructions were provided to the                            patient.                           - Resume previous diet.                           - Continue present medications.                           - Await pathology results.                           - Repeat colonoscopy in 5-10 years for surveillance                            based on pathology results. Napoleon Form, MD 05/30/2022 1:50:05 PM This report has been signed  electronically.

## 2022-05-30 NOTE — Progress Notes (Signed)
Called to room to assist during endoscopic procedure.  Patient ID and intended procedure confirmed with present staff. Received instructions for my participation in the procedure from the performing physician.  

## 2022-06-02 ENCOUNTER — Telehealth: Payer: Self-pay

## 2022-06-02 NOTE — Telephone Encounter (Signed)
  Follow up Call-     05/30/2022    1:01 PM  Call back number  Post procedure Call Back phone  # [phone]  Permission to leave phone message Yes     Patient questions:  Do you have a fever, pain , or abdominal swelling? No. Pain Score  0 *  Have you tolerated food without any problems? Yes.    Have you been able to return to your normal activities? Yes.    Do you have any questions about your discharge instructions: Diet   No. Medications  No. Follow up visit  No.  Do you have questions or concerns about your Care? No.  Actions: * If pain score is 4 or above: No action needed, pain <4.

## 2022-06-10 ENCOUNTER — Encounter: Payer: Self-pay | Admitting: Gastroenterology

## 2023-03-06 ENCOUNTER — Encounter (INDEPENDENT_AMBULATORY_CARE_PROVIDER_SITE_OTHER): Payer: Self-pay | Admitting: Otolaryngology

## 2023-04-29 ENCOUNTER — Telehealth (INDEPENDENT_AMBULATORY_CARE_PROVIDER_SITE_OTHER): Payer: Self-pay | Admitting: Otolaryngology

## 2023-04-29 ENCOUNTER — Telehealth (INDEPENDENT_AMBULATORY_CARE_PROVIDER_SITE_OTHER): Payer: Self-pay | Admitting: Audiology

## 2023-04-29 NOTE — Telephone Encounter (Signed)
 Reminder Call:  Date: 04/30/2023 Status: Sch  Time: 11:15 AM Confirmed time and location w/patient-[address] [place], [place] [address]

## 2023-04-29 NOTE — Telephone Encounter (Signed)
 Reminder Call:  Date: 04/30/2023 Status: Sch  Time: 10:30 AM Confirmed time and location w/patient-[address] [place], [place] [address]

## 2023-04-30 ENCOUNTER — Encounter (INDEPENDENT_AMBULATORY_CARE_PROVIDER_SITE_OTHER): Payer: Self-pay

## 2023-04-30 ENCOUNTER — Encounter (INDEPENDENT_AMBULATORY_CARE_PROVIDER_SITE_OTHER): Payer: Self-pay | Admitting: Otolaryngology

## 2023-04-30 ENCOUNTER — Ambulatory Visit (INDEPENDENT_AMBULATORY_CARE_PROVIDER_SITE_OTHER): Payer: 59 | Admitting: Audiology

## 2023-04-30 ENCOUNTER — Ambulatory Visit (INDEPENDENT_AMBULATORY_CARE_PROVIDER_SITE_OTHER): Payer: 59 | Admitting: Otolaryngology

## 2023-04-30 VITALS — BP 117/80 | HR 71 | Ht 62.0 in | Wt 163.0 lb

## 2023-04-30 DIAGNOSIS — H903 Sensorineural hearing loss, bilateral: Secondary | ICD-10-CM

## 2023-04-30 NOTE — Progress Notes (Signed)
  [address] [place], [place] [address] [phone]  Audiological Evaluation    Name: Donna Maxwell     DOB:   04-07-1975      MRN:   [id number]                                                                                     Service Date: 04/30/2023     Accompanied by: unaccompanied    Patient comes today after Dr. Allena Katz, ENT sent a referral for a hearing evaluation due to concerns with hearing loss.   Symptoms Yes Details  Hearing loss  [x]  Longstanding for years. Last hearing test was about 30 years ago. 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  Tinnitus  []    Ear pain/ infections/pressure  [x]  Has been sick for the last several months and sometimes was told to have fluid in her ears.  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 Left ear:  Clear external ear canals and notable landmarks visualized on the tympanic membrane.  Tympanometry: Right ear: Type A- Normal external ear canal volume with normal middle ear pressure and tympanic membrane compliance Left ear: Type A- Normal external ear canal volume with normal middle ear pressure and tympanic membrane compliance  Distortion Product Otoacoustic Emissions: Equipment not available.  Pure tone Audiometry: Right ear- Normal to moderately severe (with a notch at 3000 Hz)  sensorineural hearing loss from 250 Hz - 8000 Hz. Left ear-  Normal to moderate (notch at 4000 Hz) sensorineural hearing loss from 250 Hz - 8000 Hz.  Speech Audiometry: Right ear- Speech Reception Threshold (SRT) was obtained at 30 dBHL. Left ear-Speech Reception Threshold (SRT) was obtained at 30 dBHL.   Word Recognition Score Tested using NU-6  (MLV) Right ear: 100% was obtained at a presentation level of 75 dBHL with contralateral masking which is deemed as  excellent. Left ear: 100% was obtained at a presentation level of 75 dBHL with contralateral masking which is deemed as  excellent.   The hearing test results were completed under headphones and re-checked with inserts and results are deemed to be of good reliability. Test technique:  conventional      Recommendations: Follow up with ENT as scheduled for today. Return for a hearing evaluation if concerns with hearing changes arise or per MD recommendation. Use hearing protection when exposed to loud/damaging sounds.  Consider a communication needs assessment after medical clearance for hearing aids is obtained.   Mattheu Brodersen MARIE LEROUX-MARTINEZ, AUD

## 2023-04-30 NOTE — Progress Notes (Signed)
 Dear Dr. Wynelle Link, Here is my assessment for our mutual patient, Donna Maxwell. Thank you for allowing me the opportunity to care for your patient. Please do not hesitate to contact me should you have any other questions. Sincerely, Dr. Jovita Kussmaul  Otolaryngology Clinic Note Referring provider: Dr. Wynelle Link HPI:  Donna Maxwell is a 48 y.o. female kindly referred by Dr. Wynelle Link for evaluation of hearing loss  Initial visit (04/2023): Patient reports: bilateral hearing loss, ongoing for years. Feels like it has gotten worse. Background noise makes things worse, certain pitches are worse. Feels like she has to read lips and say "huh" a lot. She reports that she also has intermittent feeling of fluid in the ears, mostly associated with a URI. Prednisone helps. 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  Patient denies: ear pain, fullness, vertigo, drainage, tinnitus Patient additionally denies: deep pain in ear canal, eustachian tube symptoms such as popping, crackling, sensitive to pressure changes Patient also denies barotrauma, vestibular suppressant use, ototoxic medication use Prior ear surgery: no No FHX of early hearing loss  H&N Surgery: no Personal or FHx of bleeding dz or anesthesia difficulty: no  Tobacco: no. Occupation: Works in a [employer], [employer] Review of Additional Tests or Records:  Dr. Wynelle Link (02/05/2023) PCP referral notes reviewed and uploaded or available in chart in media tab: noted fluid behind b/l TM; and noted b/l SNHL; Rx: Prednisone, and ref ENT CBC 03/07/2021: WBC 6.2, Hgb 15.6, Plt 267 04/30/2023 Audiogram was independently reviewed and interpreted by me and it reveals A/A tymps; mild downsloping to mod and then upsloping HL b/l; WRT 100% AU at 75dB HL; mild asymmetry in higher frequencies   SNHL= Sensorineural hearing loss   PMH/Meds/All/SocHx/FamHx/ROS:   Past Medical History:  Diagnosis Date   History of seizures as a child    per pt as child  febrile seizures until age 66, none since   Menorrhagia    Uterine leiomyoma      Past Surgical History:  Procedure Laterality Date   ABLATION     uterine wall scraped jan 57,8469   DILATATION & CURETTAGE/HYSTEROSCOPY WITH MYOSURE N/A 03/07/2021   Procedure: HYSTEROSCOPY WITH MYOSURE RESECTION OF FIBROID;  Surgeon: Candice Camp, MD;  Location: [facility];  Service: Gynecology;  Laterality: N/A;   MOUTH SURGERY     2020    Family History  Problem Relation Age of Onset   Hypertension Mother    Hyperlipidemia Mother    Heart failure Mother    Cancer Father    Breast cancer Sister 24   Diverticulitis Sister    Lung cancer Sister    Colon cancer Neg Hx    Colon polyps Neg Hx    Crohn's disease Neg Hx    Esophageal cancer Neg Hx    Rectal cancer Neg Hx    Stomach cancer Neg Hx    Ulcerative colitis Neg Hx      Social Connections: Not on file      Current Outpatient Medications:    acetaminophen (TYLENOL) 500 MG tablet, Take 500 mg by mouth every 6 (six) hours as needed. (Patient not taking: Reported on 04/30/2023), Disp: , Rfl:    Physical Exam:   BP 117/80 (BP Location: Left Arm, Patient Position: Sitting, Cuff Size: Normal)   Pulse 71   Ht 5\' 2"  (1.575 m)   Wt 163 lb (73.9 kg)   SpO2 97%   BMI 29.81 kg/m   Salient  findings:  CN II-XII intact Given history and complaints, ear microscopy was indicated and performed for evaluation with findings as below in physical exam section and in procedures. Bilateral EAC clear and TM intact with well pneumatized middle ear spaces Weber 512: mid Rinne 512: AC > BC b/l   Anterior rhinoscopy: Septum intact; bilateral inferior turbinates without significant hypertrophy No lesions of oral cavity/oropharynx No obviously palpable neck masses/lymphadenopathy/thyromegaly No respiratory distress or stridor  Seprately Identifiable Procedures:  Procedure: Bilateral ear microscopy using microscope (CPT [id number]) Pre-procedure  diagnosis: bilateral hearing loss Post-procedure diagnosis: same Indication: see above given patient's otologic complaints and history, for improved and comprehensive examination of external ear and tympanic membrane, bilateral otologic examination using microscope was performed  Procedure: Patient was placed semi-recumbent. Both ear canals were examined using the microscope with findings above. Some cerumen removed on left and on right using suction and currette.  Patient tolerated the procedure well.   Impression & Plans:  Donna Maxwell is a 48 y.o. female with:  1. Sensorineural hearing loss (SNHL) of both ears    Most likely related to presbycusis given history and testing. She does have some asymmetry, and we discussed MRI but deferred. Discussed cerumen mgmt strategies as well. She will think about amplification, otherwise will f/u 1 year given asymmetry  See below regarding exact medications prescribed this encounter including dosages and route: No orders of the defined types were placed in this encounter.     Thank you for allowing me the opportunity to care for your patient. Please do not hesitate to contact me should you have any other questions.  Sincerely, Jovita Kussmaul, MD Otolaryngologist (ENT), [facility] Phone: [phone] Fax: [phone]  04/30/2023, 12:58 PM   MDM:  Level 3 - [phone] Complexity/Problems addressed: low Data complexity: mod - independent review of notes, labs, ordering tests - Morbidity: low  - Prescription Drug prescribed or managed: no

## 2023-07-14 ENCOUNTER — Other Ambulatory Visit: Payer: Self-pay | Admitting: Obstetrics and Gynecology

## 2023-07-14 DIAGNOSIS — Z1231 Encounter for screening mammogram for malignant neoplasm of breast: Secondary | ICD-10-CM

## 2023-07-16 ENCOUNTER — Ambulatory Visit
Admission: RE | Admit: 2023-07-16 | Discharge: 2023-07-16 | Discharge status: Home / Self Care | Source: Ambulatory Visit | Attending: Obstetrics and Gynecology | Admitting: Obstetrics and Gynecology

## 2023-07-16 DIAGNOSIS — Z1231 Encounter for screening mammogram for malignant neoplasm of breast: Secondary | ICD-10-CM

## 2023-08-05 ENCOUNTER — Other Ambulatory Visit: Payer: Self-pay | Admitting: Obstetrics and Gynecology

## 2023-08-05 DIAGNOSIS — N6320 Unspecified lump in the left breast, unspecified quadrant: Secondary | ICD-10-CM

## 2023-08-20 ENCOUNTER — Ambulatory Visit
Admission: RE | Admit: 2023-08-20 | Discharge: 2023-08-20 | Disposition: A | Discharge status: Home / Self Care | Source: Ambulatory Visit | Attending: Obstetrics and Gynecology | Admitting: Obstetrics and Gynecology

## 2023-08-20 DIAGNOSIS — N6320 Unspecified lump in the left breast, unspecified quadrant: Secondary | ICD-10-CM

## 2024-03-25 ENCOUNTER — Emergency Department (HOSPITAL_COMMUNITY)

## 2024-03-25 ENCOUNTER — Emergency Department (HOSPITAL_COMMUNITY): Admission: EM | Admit: 2024-03-25 | Source: Home / Self Care

## 2024-03-25 ENCOUNTER — Other Ambulatory Visit: Payer: Self-pay

## 2024-03-25 ENCOUNTER — Encounter (HOSPITAL_COMMUNITY): Payer: Self-pay

## 2024-03-25 LAB — CBC WITH DIFFERENTIAL/PLATELET
Abs Immature Granulocytes: 0.03 10*3/uL (ref 0.00–0.07)
Basophils Absolute: 0.1 10*3/uL (ref 0.0–0.1)
Basophils Relative: 1 %
Eosinophils Absolute: 0.4 10*3/uL (ref 0.0–0.5)
Eosinophils Relative: 4 %
HCT: 39.2 % (ref 36.0–46.0)
Hemoglobin: 14.1 g/dL (ref 12.0–15.0)
Immature Granulocytes: 0 %
Lymphocytes Relative: 23 %
Lymphs Abs: 2.3 10*3/uL (ref 0.7–4.0)
MCH: 30.8 pg (ref 26.0–34.0)
MCHC: 36 g/dL (ref 30.0–36.0)
MCV: 85.6 fL (ref 80.0–100.0)
Monocytes Absolute: 0.7 10*3/uL (ref 0.1–1.0)
Monocytes Relative: 7 %
Neutro Abs: 6.2 10*3/uL (ref 1.7–7.7)
Neutrophils Relative %: 65 %
Platelets: 294 10*3/uL (ref 150–400)
RBC: 4.58 MIL/uL (ref 3.87–5.11)
RDW: 12.8 % (ref 11.5–15.5)
WBC: 9.6 10*3/uL (ref 4.0–10.5)
nRBC: 0 % (ref 0.0–0.2)

## 2024-03-25 LAB — RESP PANEL BY RT-PCR (RSV, FLU A&B, COVID)  RVPGX2
Influenza A by PCR: NEGATIVE
Influenza B by PCR: NEGATIVE
Resp Syncytial Virus by PCR: NEGATIVE
SARS Coronavirus 2 by RT PCR: NEGATIVE

## 2024-03-25 LAB — BASIC METABOLIC PANEL WITH GFR
Anion gap: 10 (ref 5–15)
BUN: 13 mg/dL (ref 6–20)
CO2: 24 mmol/L (ref 22–32)
Calcium: 9.1 mg/dL (ref 8.9–10.3)
Chloride: 104 mmol/L (ref 98–111)
Creatinine, Ser: 0.85 mg/dL (ref 0.44–1.00)
GFR, Estimated: >60 mL/min
Glucose, Bld: 97 mg/dL (ref 70–99)
Potassium: 4 mmol/L (ref 3.5–5.1)
Sodium: 137 mmol/L (ref 135–145)

## 2024-03-25 NOTE — ED Provider Triage Note (Signed)
 Emergency Medicine Provider Triage Evaluation Note  Donna Maxwell , a 49 y.o. female  was evaluated in triage.  Pt complains of URI symptoms.  Patient reports over the past 2 weeks she has had URI symptoms.  However the past few days it has gotten progressively worse.  She reports that she is being very short of breath.  She denies any fever.  She reports some congestion and ear fullness.  On physical exam lung sounds were clear bilaterally.  Tympanic membranes were a little erythemic bilaterally.  Patient denies any chest pain.  She has recent been travel so she does not know if she has been running by the has been sick recently  Review of Systems  Positive: Cough congestion Negative:   Physical Exam  BP 136/82 (BP Location: Left Arm)   Pulse 73   Temp 98.2 F (36.8 C) (Oral)   Resp 17   SpO2 96%  Gen:   Awake, no distress   Resp:  Normal effort  MSK:   Moves extremities without difficulty  Other:    Medical Decision Making  Medically screening exam initiated at 8:49 PM.  Appropriate orders placed.  Donna Maxwell was informed that the remainder of the evaluation will be completed by another provider, this initial triage assessment does not replace that evaluation, and the importance of remaining in the ED until their evaluation is complete.     Donna Maxwell, [place] 03/25/24 2050

## 2024-03-25 NOTE — ED Triage Notes (Signed)
 Pt in POV with reported sob after cold symptoms that began 2wks ago. Pt states sob got worse a few days ago, also reports bilateral ear pain and nonproductive cough, denies fevers.
